# Patient Record
Sex: Female | Born: 1995 | Race: White | Hispanic: No | Marital: Single | Smoking: Current every day smoker
Health system: Southern US, Community
[De-identification: ages and names within clinical notes are randomized; demographics above are authoritative.]

---
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
## 2016-09-12 ENCOUNTER — Emergency Department (HOSPITAL_COMMUNITY)
Admission: EM | Admit: 2016-09-12 | Discharge: 2016-09-13 | Disposition: A | Discharge status: Home / Self Care | Payer: Self-pay | Attending: Emergency Medicine | Admitting: Emergency Medicine

## 2016-09-12 ENCOUNTER — Encounter (HOSPITAL_COMMUNITY): Payer: Self-pay | Admitting: Emergency Medicine

## 2016-09-12 DIAGNOSIS — L0291 Cutaneous abscess, unspecified: Secondary | ICD-10-CM

## 2016-09-12 DIAGNOSIS — L0231 Cutaneous abscess of buttock: Secondary | ICD-10-CM | POA: Insufficient documentation

## 2016-09-12 DIAGNOSIS — N6320 Unspecified lump in the left breast, unspecified quadrant: Secondary | ICD-10-CM | POA: Insufficient documentation

## 2016-09-12 DIAGNOSIS — F17200 Nicotine dependence, unspecified, uncomplicated: Secondary | ICD-10-CM | POA: Insufficient documentation

## 2016-09-12 DIAGNOSIS — N63 Unspecified lump in unspecified breast: Secondary | ICD-10-CM

## 2016-09-12 NOTE — ED Provider Notes (Signed)
[facility]-EMERGENCY DEPT Provider Note   CSN: [id number] Arrival date & time: 09/12/16  1953  By signing my name below, I, Doreatha MartinEva Mathews, attest that this documentation has been prepared under the direction and in the presence of  Buel ReamAlexandra Saori Umholtz, PA-C. Electronically Signed: Doreatha MartinEva Mathews, ED Scribe. 09/12/16. 12:13 AM.   History   Chief Complaint Chief Complaint  Patient presents with  . Abscess    HPI Chelsea Cooper is a 20 y.o. female who presents to the Emergency Department complaining multiple areas of moderate, gradually worsening pain and swelling to the left buttock and left breast onset 2 days ago. Pt states the area on her left buttock began to drain last night, but the area on her breast has not been draining. She states the pain on her left buttock has improved since the area began draining. Pt states pain is worsened with palpation and direct pressure. Per pt, she got her nipples pierced 3 weeks ago. She reports h/o similar areas. She denies known fever, chills, CP, SOB, nausea, vomiting, abdominal pain. NKDA.   The history is provided by the patient. No language interpreter was used.    History reviewed. No pertinent past medical history.  There are no active problems to display for this patient.   History reviewed. No pertinent surgical history.  OB History    No data available       Home Medications    Prior to Admission medications   Medication Sig Start Date End Date Taking? Authorizing Provider  cephALEXin (KEFLEX) 500 MG capsule Take 1 capsule (500 mg total) by mouth 4 (four) times daily. 09/13/16   Emi HolesAlexandra M Bobbie Virden, PA-C  sulfamethoxazole-trimethoprim (BACTRIM DS,SEPTRA DS) 800-160 MG tablet Take 1 tablet by mouth 2 (two) times daily. 09/13/16 09/20/16  Emi HolesAlexandra M Baily Serpe, PA-C    Family History No family history on file.  Social History Social History  Substance Use Topics  . Smoking status: Current Every Day Smoker  . Smokeless tobacco: Never Used  .  Alcohol use No     Allergies   Patient has no known allergies.   Review of Systems Review of Systems  Constitutional: Negative for chills and fever.  HENT: Negative for facial swelling and sore throat.   Respiratory: Negative for shortness of breath.   Cardiovascular: Negative for chest pain.  Gastrointestinal: Negative for abdominal pain, nausea and vomiting.  Genitourinary: Negative for dysuria.  Musculoskeletal: Negative for back pain.  Skin: Negative for rash and wound.       +pain, swelling and drainage to left buttock and left breast   Neurological: Negative for headaches.  Psychiatric/Behavioral: The patient is not nervous/anxious.      Physical Exam Updated Vital Signs BP 146/86 (BP Location: Right Arm)   Pulse 73   Temp 98.3 F (36.8 C) (Oral)   Resp 18   Ht 5\' 5"  (1.651 m)   Wt 61.9 kg   LMP 08/20/2016 (Approximate)   SpO2 100%   BMI 22.73 kg/m   Physical Exam  Constitutional: She appears well-developed and well-nourished. No distress.  HENT:  Head: Normocephalic and atraumatic.  Mouth/Throat: Oropharynx is clear and moist. No oropharyngeal exudate.  Eyes: Conjunctivae are normal. Pupils are equal, round, and reactive to light. Right eye exhibits no discharge. Left eye exhibits no discharge. No scleral icterus.  Neck: Normal range of motion. Neck supple. No thyromegaly present.  Cardiovascular: Normal rate, regular rhythm, normal heart sounds and intact distal pulses.  Exam reveals no gallop and no  friction rub.   No murmur heard. Pulmonary/Chest: Effort normal and breath sounds normal. No stridor. No respiratory distress. She has no wheezes. She has no rales.  Abdominal: Soft. Bowel sounds are normal. She exhibits no distension. There is no tenderness. There is no rebound and no guarding.  Musculoskeletal: She exhibits no edema.  Lymphadenopathy:    She has no cervical adenopathy.  Neurological: She is alert. Coordination normal.  Skin: Skin is warm and  dry. No rash noted. She is not diaphoretic. No pallor.  Left breast areola 6 o clock 4 cm x 3 cm tender, indurated nodule, no fluctuance   Left posterior lateral thigh, there is a 10 cm area of erythema, with a 1 cm central area of active drainage with surrounding 4 cm of induration; tenderness throughout affected area  Psychiatric: She has a normal mood and affect.  Nursing note and vitals reviewed.    ED Treatments / Results   DIAGNOSTIC STUDIES: Oxygen Saturation is 100% on RA, normal by my interpretation.    COORDINATION OF CARE: 11:56 PM Discussed treatment plan with pt at bedside which includes wound care and pt agreed to plan.    Labs (all labs ordered are listed, but only abnormal results are displayed) Labs Reviewed  POC URINE PREG, ED    EKG  EKG Interpretation None       Radiology No results found.  Procedures Procedures (including critical care time)  Medications Ordered in ED Medications - No data to display   Initial Impression / Assessment and Plan / ED Course  I have reviewed the triage vital signs and the nursing notes.  Pertinent labs & imaging results that were available during my care of the patient were reviewed by me and considered in my medical decision making (see chart for details).  Clinical Course     Patient with skin abscess to the left buttock. Moderate amount of purulent drainage was expressed by applying pressure to the area on exam. Loculation was attempted; however, pt could not tolerate d/t pain. Irrigation was also attempted with success. Wound dressed with sterile dressing and wound care instructions provided to patient. Wound recheck in 2 days. Supportive care and return precautions discussed, including warm compresses. Pt sent home with Bactrim and Keflex. Urine pregnancy negative. Patient to follow-up with OB/GYN if her breast nodule was not improved after antibiotics. I also gave the patient follow-up to general surgery as  needed. Return precautions discussed. Patient understands and agrees with plan. Patient vitals stable throughout ED course and discharged in satisfactory condition. I discussed patient case with Dr. Bebe ShaggyWickline who guided the patient's management and agrees with plan.    Final Clinical Impressions(s) / ED Diagnoses   Final diagnoses:  Abscess  Breast nodule    New Prescriptions New Prescriptions   CEPHALEXIN (KEFLEX) 500 MG CAPSULE    Take 1 capsule (500 mg total) by mouth 4 (four) times daily.   SULFAMETHOXAZOLE-TRIMETHOPRIM (BACTRIM DS,SEPTRA DS) 800-160 MG TABLET    Take 1 tablet by mouth 2 (two) times daily.    I personally performed the services described in this documentation, which was scribed in my presence. The recorded information has been reviewed and is accurate.    Emi Holeslexandra M Malori Myers, PA-C 09/13/16 [id number]    Zadie Rhineonald Wickline, MD 09/13/16 Emeline [id number]

## 2016-09-12 NOTE — ED Notes (Signed)
Pt has two places on her body that are swollen and very sore to the touch. one is on her left back thigh and is busted open and draining, the other is on her left breast under the nipple.

## 2016-09-12 NOTE — ED Triage Notes (Signed)
Patient reports abscess at left buttocks with drainage onset 2 days ago , denies fever or chills .

## 2016-09-13 LAB — POC URINE PREG, ED: Preg Test, Ur: NEGATIVE

## 2016-09-13 MED ORDER — CEPHALEXIN 500 MG PO CAPS: 500.0000 mg | ORAL_CAPSULE | Freq: Four times a day (QID) | ORAL | 0 refills | Status: AC

## 2016-09-13 MED ORDER — SULFAMETHOXAZOLE-TRIMETHOPRIM 800-160 MG PO TABS: 1.0000 | ORAL_TABLET | Freq: Two times a day (BID) | ORAL | 0 refills | Status: AC

## 2016-09-13 NOTE — Discharge Instructions (Signed)
Medications: Bactrim, Keflex  Treatment: Take Bactrim and Keflex as prescribed. Make sure to finish all this medication. Wash the area with warm soapy water once daily and apply clean dressing.  Follow-up: Please follow-up with women's outpatient clinic if your symptoms are not improving after antibiotic treatment. You may also need to [facility] per recommendation of the women's outpatient clinic. Please return to emergency department if you develop any new or worsening symptoms, including fever, increasing pain, redness, streaking from the area of your abscess on her leg. Please return to emergency department or go to an urgent care in 2 days for wound recheck.

## 2018-11-26 ENCOUNTER — Emergency Department
Admission: EM | Admit: 2018-11-26 | Discharge: 2018-11-26 | Disposition: A | Discharge status: Home / Self Care | Payer: Self-pay | Attending: Emergency Medicine | Admitting: Emergency Medicine

## 2018-11-26 ENCOUNTER — Other Ambulatory Visit: Payer: Self-pay

## 2018-11-26 DIAGNOSIS — F17210 Nicotine dependence, cigarettes, uncomplicated: Secondary | ICD-10-CM | POA: Insufficient documentation

## 2018-11-26 DIAGNOSIS — H65111 Acute and subacute allergic otitis media (mucoid) (sanguinous) (serous), right ear: Secondary | ICD-10-CM | POA: Insufficient documentation

## 2018-11-26 MED ORDER — AMOXICILLIN 875 MG PO TABS: 875.0000 mg | ORAL_TABLET | Freq: Two times a day (BID) | ORAL | 0 refills | Status: AC

## 2018-11-26 MED ORDER — AMOXICILLIN 500 MG PO CAPS
1000.0000 mg | ORAL_CAPSULE | Freq: Once | ORAL | Status: AC
  Administered 2018-11-26: 1000 mg via ORAL
  Filled 2018-11-26: qty 2

## 2018-11-26 NOTE — ED Triage Notes (Signed)
Right ear pain since 2200. Pt denies known discharge.

## 2018-11-26 NOTE — ED Provider Notes (Signed)
[facility] Emergency Department Provider Note  ____________________________________________  Time seen: Approximately 11:01 PM  I have reviewed the triage vital signs and the nursing notes.   HISTORY  Chief Complaint Otalgia    HPI Chelsea Cooper is a 23 y.o. female who presents the emergency department complaining of right ear pain.  Patient reports that pain began this evening.  She denies any trauma to the area.  She denies any recent swimming.  Patient reports that she did have the flu and is just improved from this course.  Patient denies any hearing loss.  No fevers or chills.  Patient denies any nasal congestion, sore throat, cough at this time.  No medications for this complaint prior to arrival.    No past medical history on file.  There are no active problems to display for this patient.   No past surgical history on file.  Prior to Admission medications   Medication Sig Start Date End Date Taking? Authorizing Provider  amoxicillin (AMOXIL) 875 MG tablet Take 1 tablet (875 mg total) by mouth 2 (two) times daily. 11/26/18   , Delorise Royals, PA-C  cephALEXin (KEFLEX) 500 MG capsule Take 1 capsule (500 mg total) by mouth 4 (four) times daily. 09/13/16   Emi Holes, PA-C    Allergies Patient has no known allergies.  No family history on file.  Social History Social History   Tobacco Use  . Smoking status: Current Every Day Smoker  . Smokeless tobacco: Never Used  Substance Use Topics  . Alcohol use: No  . Drug use: No     Review of Systems  Constitutional: No fever/chills Eyes: No visual changes. No discharge ENT: Positive for nontraumatic right ear pain. Cardiovascular: no chest pain. Respiratory: no cough. No SOB. Gastrointestinal: No abdominal pain.  No nausea, no vomiting. Musculoskeletal: Negative for musculoskeletal pain. Skin: Negative for rash, abrasions, lacerations, ecchymosis. Neurological: Negative for  headaches, focal weakness or numbness. 10-point ROS otherwise negative.  ____________________________________________   PHYSICAL EXAM:  VITAL SIGNS: ED Triage Vitals [11/26/18 2202]  Enc Vitals Group     BP 126/88     Pulse Rate 100     Resp 16     Temp 98.2 F (36.8 C)     Temp Source Oral     SpO2 100 %     Weight 130 lb (59 kg)     Height 5\' 5"  (1.651 m)     Head Circumference      Peak Flow      Pain Score 10     Pain Loc      Pain Edu?      Excl. in GC?      Constitutional: Alert and oriented. Well appearing and in no acute distress. Eyes: Conjunctivae are normal. PERRL. EOMI. Head: Atraumatic. ENT:      Ears: EACs unremarkable bilaterally.  TM on right is grossly injected, bulging.  No TM perforation.  No tenderness to percussion over the mastoid bones.      Nose: No congestion/rhinnorhea.      Mouth/Throat: Mucous membranes are moist.  Neck: No stridor.  Neck supple according to motion Hematological/Lymphatic/Immunilogical: No cervical lymphadenopathy. Cardiovascular: Normal rate, regular rhythm. Normal S1 and S2.  Good peripheral circulation. Respiratory: Normal respiratory effort without tachypnea or retractions. Lungs CTAB. Good air entry to the bases with no decreased or absent breath sounds. Musculoskeletal: Full range of motion to all extremities. No gross deformities appreciated. Neurologic:  Normal speech and  language. No gross focal neurologic deficits are appreciated.  Skin:  Skin is warm, dry and intact. No rash noted. Psychiatric: Mood and affect are normal. Speech and behavior are normal. Patient exhibits appropriate insight and judgement.   ____________________________________________   LABS (all labs ordered are listed, but only abnormal results are displayed)  Labs Reviewed - No data to display ____________________________________________  EKG   ____________________________________________  RADIOLOGY   No results  found.  ____________________________________________    PROCEDURES  Procedure(s) performed:    Procedures    Medications  amoxicillin (AMOXIL) capsule 1,000 mg (has no administration in time range)     ____________________________________________   INITIAL IMPRESSION / ASSESSMENT AND PLAN / ED COURSE  Pertinent labs & imaging results that were available during my care of the patient were reviewed by me and considered in my medical decision making (see chart for details).  Review of the [place] CSRS was performed in accordance of the [facility] prior to dispensing any controlled drugs.      Patient's diagnosis is consistent with otitis media of the right ear.  Patient presents emergency department with right ear pain beginning tonight.  Patient did just get over influenza.  Patient exam reveals findings consistent with otitis media.  Patient will be started on amoxicillin for same.  Flonase and Zyrtec at home to aid in improvement of symptoms.  Tylenol and Motrin as needed.  Follow-up primary care as needed..  Patient is given ED precautions to return to the ED for any worsening or new symptoms.     ____________________________________________  FINAL CLINICAL IMPRESSION(S) / ED DIAGNOSES  Final diagnoses:  Acute mucoid otitis media of right ear      NEW MEDICATIONS STARTED DURING THIS VISIT:  ED Discharge Orders         Ordered    amoxicillin (AMOXIL) 875 MG tablet  2 times daily     11/26/18 2317              This chart was dictated using voice recognition software/Dragon. Despite best efforts to proofread, errors can occur which can change the meaning. Any change was purely unintentional.    Racheal Patches, PA-C 11/27/18 0018    Rockne Menghini, MD 11/29/18 [phone]

## 2019-12-23 ENCOUNTER — Other Ambulatory Visit: Payer: Self-pay

## 2019-12-23 ENCOUNTER — Emergency Department (HOSPITAL_COMMUNITY)
Admission: EM | Admit: 2019-12-23 | Discharge: 2019-12-23 | Disposition: A | Discharge status: Home / Self Care | Payer: Self-pay | Attending: Emergency Medicine | Admitting: Emergency Medicine

## 2019-12-23 DIAGNOSIS — Z79899 Other long term (current) drug therapy: Secondary | ICD-10-CM | POA: Insufficient documentation

## 2019-12-23 DIAGNOSIS — F17210 Nicotine dependence, cigarettes, uncomplicated: Secondary | ICD-10-CM | POA: Insufficient documentation

## 2019-12-23 DIAGNOSIS — H6693 Otitis media, unspecified, bilateral: Secondary | ICD-10-CM | POA: Insufficient documentation

## 2019-12-23 DIAGNOSIS — H6690 Otitis media, unspecified, unspecified ear: Secondary | ICD-10-CM

## 2019-12-23 MED ORDER — AMOXICILLIN-POT CLAVULANATE 875-125 MG PO TABS: 1.0000 | ORAL_TABLET | Freq: Two times a day (BID) | ORAL | 0 refills | Status: AC

## 2019-12-23 MED ORDER — FLUTICASONE PROPIONATE 50 MCG/ACT NA SUSP: 1.0000 | Freq: Every day | NASAL | 0 refills | Status: AC

## 2019-12-23 NOTE — ED Triage Notes (Signed)
Arrived POV from home patient reports she believes she has another infection in her right ear. Patient states the ear pain is making her head hurt. Patient has recurrent ear infections; last infection 6 months ago.

## 2019-12-23 NOTE — ED Notes (Signed)
Pt verbalizes understanding of DC instructions. Pt belongings returned and is ambulatory out of ED.    Pt left before singing signature pad

## 2019-12-23 NOTE — Discharge Instructions (Signed)
Take the medications as prescribed.  Follow-up with ear nose and throat physicians.  Return for new or worsening symptoms.

## 2019-12-23 NOTE — ED Provider Notes (Signed)
[facility] DEPT Provider Note   CSN: [id number] Arrival date & time: 12/23/19  1906     History Chief Complaint  Patient presents with  . Otalgia    Chelsea Cooper is a 24 y.o. female with history significant for recurrent ear infections who presents for evaluation of ear pain.  Patient states she has had fullness to bilateral ears however pain to her right ear.  Is also had some congestion, rhinorrhea.  Been taking Claratin at home.  Patient states this is her third ear infection over the last 6 months.  Denies fever, chills, nausea, vomiting, chest pain, shortness of breath, neck pain, neck stiffness, mastoid pain, bleeding, drainage, recent swimming.  States she did have a headache 3 days ago however none currently.  This is resolved with ibuprofen.  Tolerating p.o. intake at home without difficulty.  No recent sick contacts.  Denies additional aggravating or alleviating factors.  History obtained from patient and past medical records.  No interpreter is used.  HPI     No past medical history on file.  There are no problems to display for this patient.   No past surgical history on file.   OB History   No obstetric history on file.     No family history on file.  Social History   Tobacco Use  . Smoking status: Current Every Day Smoker  . Smokeless tobacco: Never Used  Substance Use Topics  . Alcohol use: No  . Drug use: No    Home Medications Prior to Admission medications   Medication Sig Start Date End Date Taking? Authorizing Provider  amoxicillin (AMOXIL) 875 MG tablet Take 1 tablet (875 mg total) by mouth 2 (two) times daily. 11/26/18   Cuthriell, Charline Bills, PA-C  amoxicillin-clavulanate (AUGMENTIN) 875-125 MG tablet Take 1 tablet by mouth every 12 (twelve) hours. 12/23/19   Day Deery A, PA-C  cephALEXin (KEFLEX) 500 MG capsule Take 1 capsule (500 mg total) by mouth 4 (four) times daily. 09/13/16   Law, Bea Graff,  PA-C  fluticasone (FLONASE) 50 MCG/ACT nasal spray Place 1 spray into both nostrils daily. 12/23/19   Lennyn Bellanca A, PA-C    Allergies    Patient has no known allergies.  Review of Systems   Review of Systems  Constitutional: Negative.   HENT: Positive for congestion, ear pain, postnasal drip and rhinorrhea. Negative for ear discharge, facial swelling, hearing loss, sinus pressure, sinus pain, sore throat, trouble swallowing and voice change.   Respiratory: Negative.   Cardiovascular: Negative.   Gastrointestinal: Negative.   Musculoskeletal: Negative.   Skin: Negative.   Neurological: Positive for headaches (3 days ago non currently).  All other systems reviewed and are negative.   Physical Exam Updated Vital Signs BP (!) 124/98 (BP Location: Left Arm)   Pulse 75   Temp 98 F (36.7 C) (Oral)   Resp 16   Ht 5\' 5"  (1.651 m)   Wt 59 kg   LMP 11/26/2019   SpO2 95%   BMI 21.63 kg/m   Physical Exam Vitals and nursing note reviewed.  Constitutional:      General: She is not in acute distress.    Appearance: She is well-developed. She is not ill-appearing, toxic-appearing or diaphoretic.  HENT:     Head: Normocephalic and atraumatic.     Jaw: There is normal jaw occlusion.     Comments: Mild tenderness over bilateral zygomatic     Right Ear: Hearing, ear canal and external  ear normal. No tenderness. No middle ear effusion. No mastoid tenderness. No hemotympanum. Tympanic membrane is erythematous and bulging. Tympanic membrane is not injected, scarred, perforated or retracted.     Left Ear: Hearing, tympanic membrane, ear canal and external ear normal. Tympanic membrane is not injected, scarred, perforated, erythematous, retracted or bulging.     Ears:     Comments: NO mastoid tenderness    Nose: Nose normal.     Mouth/Throat:     Mouth: Mucous membranes are moist.  Eyes:     Pupils: Pupils are equal, round, and reactive to light.  Neck:     Trachea: Trachea and  phonation normal.     Meningeal: Brudzinski's sign and Kernig's sign absent.  Cardiovascular:     Rate and Rhythm: Normal rate.     Pulses: Normal pulses.     Heart sounds: Normal heart sounds.  Pulmonary:     Effort: Pulmonary effort is normal. No respiratory distress.     Breath sounds: Normal breath sounds.  Abdominal:     General: Bowel sounds are normal. There is no distension.  Musculoskeletal:        General: Normal range of motion.     Cervical back: Full passive range of motion without pain, normal range of motion and neck supple.  Lymphadenopathy:     Cervical: No cervical adenopathy.  Skin:    General: Skin is warm and dry.  Neurological:     Mental Status: She is alert.     ED Results / Procedures / Treatments   Labs (all labs ordered are listed, but only abnormal results are displayed) Labs Reviewed - No data to display  EKG None  Radiology No results found.  Procedures Procedures (including critical care time)  Medications Ordered in ED Medications - No data to display  ED Course  I have reviewed the triage vital signs and the nursing notes.  Pertinent labs & imaging results that were available during my care of the patient were reviewed by me and considered in my medical decision making (see chart for details).  She with recurrent otitis media presents for evaluation of ear pain, congestion and rhinorrhea.  Patient with acute otitis media on exam to right.  No mastoid tenderness.  She is tolerating p.o. tube without difficulty.  No neck stiffness or neck rigidity.  Does have some minimal sinus pressure over her zygomatic's.  Given recent treatment with amoxicillin will prescribe Augmentin.  We will have her follow with ENT given this is her third ear infection over 6 months.  Patient presents with otalgia and exam consistent with acute otitis media. No concern for acute mastoiditis, meningitis. Advised follow up with ENT today for follow-up.  I have also  discussed reasons to return immediately to the ER.  Parent expresses understanding and agrees with plan.  The patient has been appropriately medically screened and/or stabilized in the ED. I have low suspicion for any other emergent medical condition which would require further screening, evaluation or treatment in the ED or require inpatient management.  Patient is hemodynamically stable and in no acute distress.  Patient able to ambulate in department prior to ED.  Evaluation does not show acute pathology that would require ongoing or additional emergent interventions while in the emergency department or further inpatient treatment.  I have discussed the diagnosis with the patient and answered all questions.  Pain is been managed while in the emergency department and patient has no further complaints prior to discharge.  Patient is comfortable with plan discussed in room and is stable for discharge at this time.  I have discussed strict return precautions for returning to the emergency department.  Patient was encouraged to follow-up with PCP/specialist refer to at discharge.     MDM Rules/Calculators/A&P                       Final Clinical Impression(s) / ED Diagnoses Final diagnoses:  Acute otitis media, unspecified otitis media type    Rx / DC Orders ED Discharge Orders         Ordered    amoxicillin-clavulanate (AUGMENTIN) 875-125 MG tablet  Every 12 hours     12/23/19 2014    fluticasone (FLONASE) 50 MCG/ACT nasal spray  Daily     12/23/19 2014           Rahmel Nedved A, PA-C 12/23/19 2017    Benjiman Core, MD 12/24/19 [phone]
